# Patient Record
Sex: Female | Born: 1995 | Race: Black or African American | Hispanic: No | Marital: Single | Smoking: Never smoker
Health system: Southern US, Community
[De-identification: ages and names within clinical notes are randomized; demographics above are authoritative.]

---
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
## 2014-06-06 ENCOUNTER — Encounter (HOSPITAL_COMMUNITY): Payer: Self-pay | Admitting: *Deleted

## 2014-06-06 ENCOUNTER — Other Ambulatory Visit (HOSPITAL_COMMUNITY)
Admission: RE | Admit: 2014-06-06 | Discharge: 2014-06-06 | Disposition: A | Discharge status: Home / Self Care | Payer: Self-pay | Source: Ambulatory Visit | Attending: Emergency Medicine | Admitting: Emergency Medicine

## 2014-06-06 ENCOUNTER — Emergency Department (INDEPENDENT_AMBULATORY_CARE_PROVIDER_SITE_OTHER)
Admission: EM | Admit: 2014-06-06 | Discharge: 2014-06-06 | Disposition: A | Discharge status: Home / Self Care | Payer: 59 | Source: Home / Self Care | Attending: Emergency Medicine | Admitting: Emergency Medicine

## 2014-06-06 DIAGNOSIS — B373 Candidiasis of vulva and vagina: Secondary | ICD-10-CM

## 2014-06-06 DIAGNOSIS — Z113 Encounter for screening for infections with a predominantly sexual mode of transmission: Secondary | ICD-10-CM | POA: Insufficient documentation

## 2014-06-06 DIAGNOSIS — N760 Acute vaginitis: Secondary | ICD-10-CM | POA: Insufficient documentation

## 2014-06-06 DIAGNOSIS — N946 Dysmenorrhea, unspecified: Secondary | ICD-10-CM

## 2014-06-06 DIAGNOSIS — B3731 Acute candidiasis of vulva and vagina: Secondary | ICD-10-CM

## 2014-06-06 MED ORDER — NORGESTIMATE-ETH ESTRADIOL 0.25-35 MG-MCG PO TABS: 1.0000 | ORAL_TABLET | Freq: Every day | ORAL | Status: AC

## 2014-06-06 MED ORDER — KETOROLAC TROMETHAMINE 30 MG/ML IJ SOLN
30.0000 mg | Freq: Once | INTRAMUSCULAR | Status: AC
  Administered 2014-06-06: 30 mg via INTRAMUSCULAR

## 2014-06-06 MED ORDER — KETOROLAC TROMETHAMINE 30 MG/ML IJ SOLN
INTRAMUSCULAR | Status: AC
  Filled 2014-06-06: qty 1

## 2014-06-06 MED ORDER — FLUCONAZOLE 150 MG PO TABS: 150.0000 mg | ORAL_TABLET | Freq: Every day | ORAL | Status: AC

## 2014-06-06 NOTE — ED Notes (Signed)
C/o lower abdominal cramps onset this afternoon.  Her period started today.  States this is" alot worse than usual."  C/o vaginal discharge onset 1 week ago.  She thinks it is yeast.  This is the 3rd time in the past few months she has had a yeast infection.

## 2014-06-06 NOTE — ED Provider Notes (Signed)
CSN: [id number]     Arrival date & time 06/06/14  1649 History   First MD Initiated Contact with Patient 06/06/14 1659     Chief Complaint  Patient presents with  . Abdominal Cramping   (Consider location/radiation/quality/duration/timing/severity/associated sxs/prior Treatment) HPI  She is a 18 year old woman here for evaluation of abdominal cramping and vaginal discharge. She states her period started today and she has been having lower abdominal cramping. She states she typically gets some cramps with her period, but not this bad. She has taken Tylenol without improvement.  She also reports a thick white vaginal discharge for the last week. It is associated with some vaginal itching. She has had similar symptoms 3 times in the last several months. She has taken over-the-counter Monistat with minimal improvement.  She is sexually active with one partner. They use condoms. She denies any abdominal pain. No fevers or chills. No nausea or vomiting.  She would like a prescription for birth control. She does get occasional headaches. No personal or family history of blood clots. She is a nonsmoker.  History reviewed. No pertinent past medical history. History reviewed. No pertinent past surgical history. History reviewed. No pertinent family history. History  Substance Use Topics  . Smoking status: Never Smoker   . Smokeless tobacco: Not on file  . Alcohol Use: Yes     Comment: occ   OB History    No data available     Review of Systems  Constitutional: Negative for fever.  Gastrointestinal: Negative for nausea, vomiting and abdominal pain.  Genitourinary: Positive for vaginal discharge and pelvic pain (cramps).    Allergies  Review of patient's allergies indicates no known allergies.  Home Medications   Prior to Admission medications   Medication Sig Start Date End Date Taking? Authorizing Provider  acetaminophen (TYLENOL) 500 MG tablet Take 1,000 mg by mouth every 6 (six)  hours as needed.   Yes Historical Provider, MD  dicyclomine (BENTYL) 20 MG tablet Take 1 tablet (20 mg total) by mouth 2 (two) times daily. 10/11/12   Heather Laisure, PA-C  fluconazole (DIFLUCAN) 150 MG tablet Take 1 tablet (150 mg total) by mouth daily. 06/06/14   Charm RingsErin J Zamariya Neal, MD  ibuprofen (ADVIL,MOTRIN) 200 MG tablet Take 200-400 mg by mouth every 6 (six) hours as needed for pain.    Historical Provider, MD  norgestimate-ethinyl estradiol (ORTHO-CYCLEN,SPRINTEC,PREVIFEM) 0.25-35 MG-MCG tablet Take 1 tablet by mouth daily. 06/06/14   Charm RingsErin J Devere Brem, MD   BP 145/94 mmHg  Pulse 72  Temp(Src) 98.3 F (36.8 C) (Oral)  Resp 16  SpO2 99%  LMP 06/06/2014 Physical Exam  Constitutional: She is oriented to person, place, and time. She appears well-developed and well-nourished. No distress.  Cardiovascular: Normal rate.   Pulmonary/Chest: Effort normal.  Abdominal: Soft. Bowel sounds are normal. She exhibits no distension. There is tenderness (mild in lower qyadrants). There is no rebound and no guarding.  Genitourinary: There is no rash on the right labia. There is no rash on the left labia.  No discharge appreciated  Neurological: She is alert and oriented to person, place, and time.    ED Course  Procedures (including critical care time) Labs Review Labs Reviewed  CERVICOVAGINAL ANCILLARY ONLY    Imaging Review No results found.   MDM   1. Menstrual cramp   2. Yeast vaginitis    We'll treat menstrual cramps with 30 mg IM Toradol. Recommended taking Tylenol or ibuprofen at home.  Although I do not see  any discharge, she describes a yeast infection very well. Prescription for Diflucan provided.  Also provided a one month prescription for OCPs. She is going home in 2 weeks, I recommended she follow-up with her regular doctor to discuss alternative options for birth control.     Charm RingsErin J Tiawanna Luchsinger, MD 06/06/14 [phone]

## 2014-06-06 NOTE — Discharge Instructions (Signed)
Take the diflucan pill (for yeast) after you finish your period. Start the birth control pills today or next Sunday. The pills take 2 weeks to become effective. You must take the pill at the same time every day. Continue to use condoms to protect against STDs. Take ibuprofen or tylenol for menstrual cramps.  Follow up with your regular doctor at home to discuss additional birth control options.

## 2014-06-07 LAB — CERVICOVAGINAL ANCILLARY ONLY
Chlamydia: NEGATIVE
Neisseria Gonorrhea: NEGATIVE
Wet Prep (BD Affirm): NEGATIVE
Wet Prep (BD Affirm): NEGATIVE
Wet Prep (BD Affirm): POSITIVE — AB

## 2014-06-07 MED ORDER — METRONIDAZOLE 500 MG PO TABS: 500.0000 mg | ORAL_TABLET | Freq: Two times a day (BID) | ORAL | Status: DC

## 2014-06-08 ENCOUNTER — Telehealth (HOSPITAL_COMMUNITY): Payer: Self-pay | Admitting: *Deleted

## 2014-06-08 NOTE — ED Notes (Addendum)
GC/Chlamydia neg., Affirm: Candida and Trich neg., Gardnerella pos.  Dr.Honig e-[facility] Flag[facility]. I called pt. and left a message to call.  Call 1. Vassie Mos[place]York, Mychael Smock M 06/08/2014 Left message.  Call 2. 06/09/2014 I called pt.  Pt. verified x 2 and given results. Pt.told she needs Flagyl for bacterial vaginosis and where to pick up Rx. States she already picked it up but has not started taking it yet.   Pt. instructed to no alcohol while taking this medication.  Pt. voiced understanding. 06/14/2014

## 2014-06-21 NOTE — ED Notes (Signed)
Phenergan 25 mg , 1 q 6 H quant #10, PRN nausea, no refill. [facility] [address] [facility] [place] answering machine at pharmacy

## 2014-06-21 NOTE — ED Notes (Signed)
Called to c/o she has vomited x 2 today . Reportedly started Rx flagyl on 11-27 from 11-17 visit. Discussed w Dr Artis FlockKindl, who advises patient this is the only thing that treats her problem, and she needs to tke as Rx until completed. Reminded she is to have NO ETOH while she is taking this medication

## 2014-08-05 ENCOUNTER — Emergency Department (HOSPITAL_COMMUNITY)
Admission: EM | Admit: 2014-08-05 | Discharge: 2014-08-05 | Disposition: A | Discharge status: Home / Self Care | Payer: 59 | Source: Home / Self Care | Attending: Family Medicine | Admitting: Family Medicine

## 2014-08-05 ENCOUNTER — Encounter (HOSPITAL_COMMUNITY): Payer: Self-pay | Admitting: Family Medicine

## 2014-08-05 DIAGNOSIS — R109 Unspecified abdominal pain: Secondary | ICD-10-CM

## 2014-08-05 LAB — POCT URINALYSIS DIP (DEVICE)
Bilirubin Urine: NEGATIVE
GLUCOSE, UA: NEGATIVE mg/dL
KETONES UR: NEGATIVE mg/dL
Leukocytes, UA: NEGATIVE
Nitrite: NEGATIVE
Protein, ur: NEGATIVE mg/dL
SPECIFIC GRAVITY, URINE: 1.02 (ref 1.005–1.030)
UROBILINOGEN UA: 0.2 mg/dL (ref 0.0–1.0)
pH: 7 (ref 5.0–8.0)

## 2014-08-05 LAB — POCT PREGNANCY, URINE: PREG TEST UR: NEGATIVE

## 2014-08-05 MED ORDER — METRONIDAZOLE 0.75 % EX GEL: 1.0000 "application " | Freq: Two times a day (BID) | CUTANEOUS | Status: AC

## 2014-08-05 MED ORDER — IBUPROFEN 600 MG PO TABS: 600.0000 mg | ORAL_TABLET | Freq: Four times a day (QID) | ORAL | Status: AC | PRN

## 2014-08-05 NOTE — ED Provider Notes (Signed)
CSN: [id number]     Arrival date & time 08/05/14  1126 History   First MD Initiated Contact with Patient 08/05/14 1153     Chief Complaint  Patient presents with  . Abdominal Cramping   (Consider location/radiation/quality/duration/timing/severity/associated sxs/prior Treatment) HPI  Abd cramping. Started last night. Improves w/ rest. Worse w/ movement. LMP started yesterday. Denies dysuria, frequency, back pain, fevers. Entire abdomen. Tolerating PO and daily BMs. Takes daily OCP and has been on them for 2 mos. Some vaginal discharge w/o odor or irritation prior to onset of period. No further discharge.   BV 8 wks ago and prescribed metro gel and could not tolerate due to GI upset. Started the [employer] and finished last dose a couple weeks ago.    History reviewed. No pertinent past medical history. History reviewed. No pertinent past surgical history. Family History  Problem Relation Age of Onset  . Ovarian cysts Paternal Aunt   . Ovarian cysts Paternal Grandmother    History  Substance Use Topics  . Smoking status: Never Smoker   . Smokeless tobacco: Not on file  . Alcohol Use: Yes     Comment: occ   OB History    No data available     Review of Systems Per HPI with all other pertinent systems negative.   Allergies  Review of patient's allergies indicates no known allergies.  Home Medications   Prior to Admission medications   Medication Sig Start Date End Date Taking? Authorizing Provider  norgestimate-ethinyl estradiol (ORTHO-CYCLEN,SPRINTEC,PREVIFEM) 0.25-35 MG-MCG tablet Take 1 tablet by mouth daily. 06/06/14  Yes Charm Rings, MD  acetaminophen (TYLENOL) 500 MG tablet Take 1,000 mg by mouth every 6 (six) hours as needed.    Historical Provider, MD  dicyclomine (BENTYL) 20 MG tablet Take 1 tablet (20 mg total) by mouth 2 (two) times daily. 10/11/12   Heather Laisure, PA-C  fluconazole (DIFLUCAN) 150 MG tablet Take 1 tablet (150 mg total) by mouth daily. 06/06/14    Charm Rings, MD  ibuprofen (ADVIL,MOTRIN) 600 MG tablet Take 1 tablet (600 mg total) by mouth every 6 (six) hours as needed. 08/05/14   Ozella Rocks, MD  metroNIDAZOLE (METROGEL) 0.75 % gel Apply 1 application topically 2 (two) times daily. Treat for 5 days 08/05/14   Ozella Rocks, MD   BP 123/91 mmHg  Pulse 82  Temp(Src) 98.5 F (36.9 C) (Oral)  Resp 14  SpO2 99%  LMP 08/05/2014 (Approximate) Physical Exam  Constitutional: She is oriented to person, place, and time. She appears well-developed and well-nourished. No distress.  HENT:  Head: Normocephalic and atraumatic.  Eyes: EOM are normal. Pupils are equal, round, and reactive to light.  Neck: Normal range of motion.  Cardiovascular: Normal rate and normal heart sounds.   Pulmonary/Chest: Effort normal and breath sounds normal.  Abdominal: Soft. Bowel sounds are normal.  Musculoskeletal: Normal range of motion. She exhibits no edema or tenderness.  Neurological: She is alert and oriented to person, place, and time.  Skin: Skin is warm. She is not diaphoretic.  Psychiatric: She has a normal mood and affect. Her behavior is normal. Judgment normal.    ED Course  Procedures (including critical care time) Labs Review Labs Reviewed - No data to display  Imaging Review No results found.   MDM   1. Abdominal cramping    Likely related to hormonal changes from OCP causing worsening menstrual cramping. May also be related to ovarian cysts as multlipel family members w/  PCOS.  Urine preg neg UA nml Motrin Refill MetroGel if discharge returns after finishing menstrual cycle.  Precautions given and all questions answered   Shelly Flattenavid Merrell, MD Family Medicine 08/05/2014, 12:13 PM      Ozella Rocksavid J Merrell, MD 08/05/14 [phone]

## 2014-08-05 NOTE — Discharge Instructions (Signed)
Your symptoms are likely related to the additional hormones from the birth control pills.  You may have developed small painful cysts on your ovaries This may need further workup if the pain continues for several months or gets worse.  Please use ibuprofen for the pain There was no evidence of infection or pregnancy today Please follow up with an OBGYN or your primary doctor if this continues

## 2014-08-05 NOTE — ED Notes (Signed)
Pt complains of abdominal cramping that started last night.  Pt is currently menstruating.

## 2014-08-05 NOTE — ED Notes (Signed)
Patient attempted, however, patient can not void at this time. Nurse is aware that specimen was not obtained

## 2014-08-06 ENCOUNTER — Other Ambulatory Visit: Payer: Self-pay | Admitting: Physician Assistant

## 2014-08-06 DIAGNOSIS — R1031 Right lower quadrant pain: Secondary | ICD-10-CM

## 2014-08-11 ENCOUNTER — Ambulatory Visit
Admission: RE | Admit: 2014-08-11 | Discharge: 2014-08-11 | Disposition: A | Discharge status: Home / Self Care | Payer: 59 | Source: Ambulatory Visit | Attending: Physician Assistant | Admitting: Physician Assistant

## 2014-08-11 DIAGNOSIS — R1031 Right lower quadrant pain: Secondary | ICD-10-CM

## 2015-06-09 IMAGING — US US PELVIS COMPLETE
1 series · 14 of 25 positions shown · non-contrast
Comparison: None

CLINICAL DATA: 19-year-old female with right lower quadrant
abdominal pain. Pain for 1 week. Initial encounter. LMP 08/04/2014.



[Series 1: us pelvis complete · 0.22mm/px · 14 of 61 slices shown]
[im 1/61]
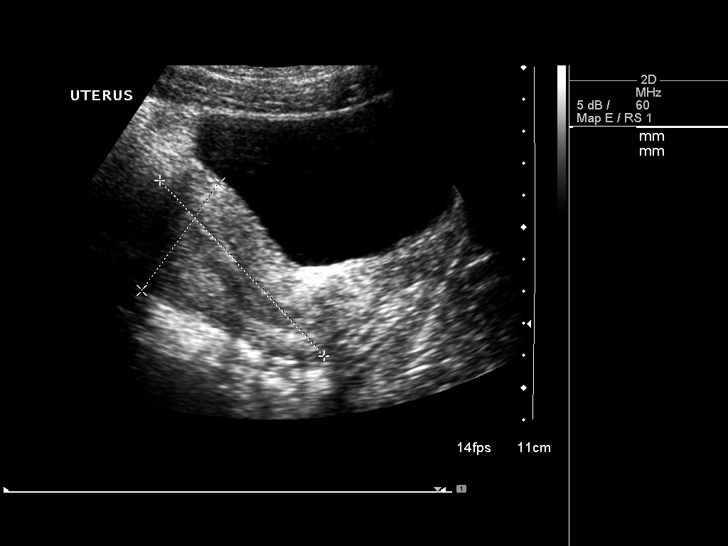
[im 6/61]
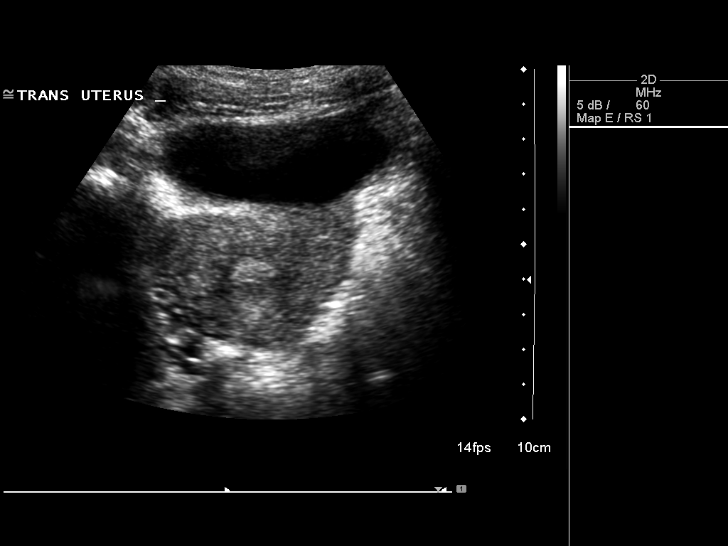
[im 11/61]
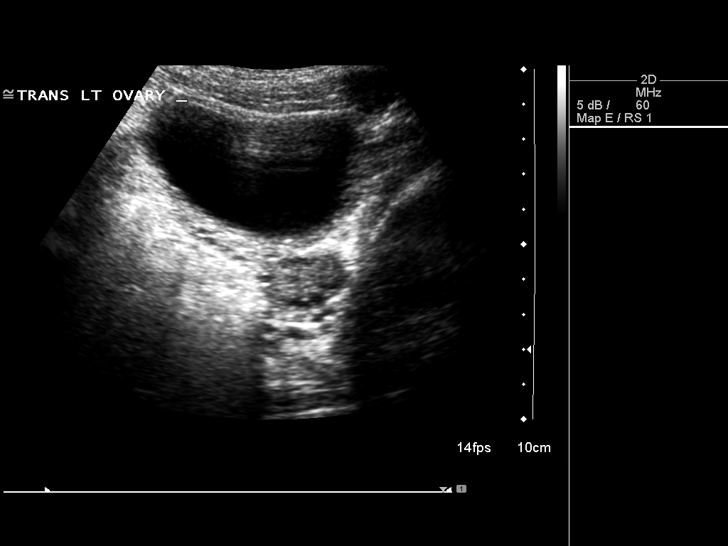
[im 16/61]
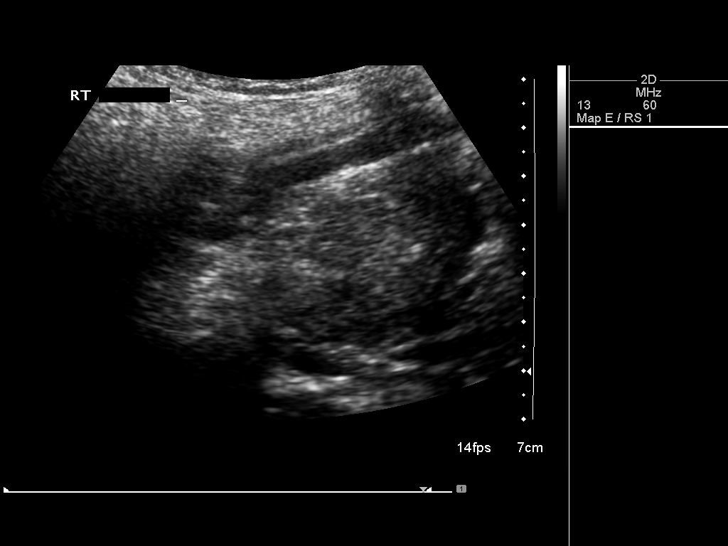
[im 21/61]
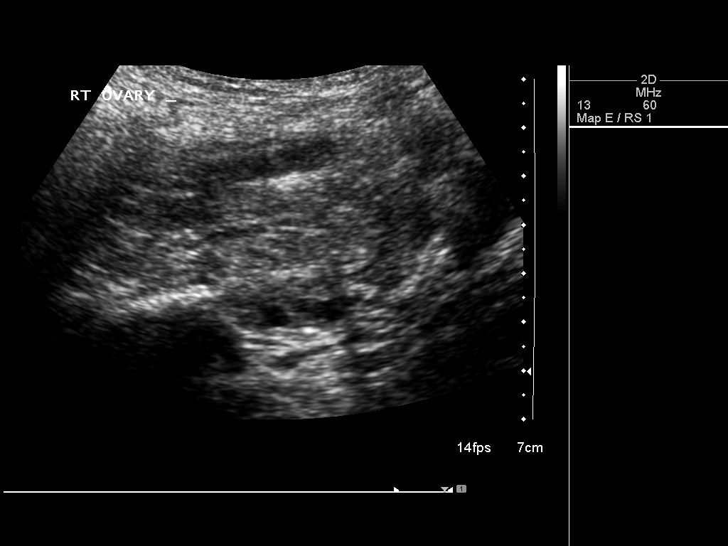
[im 23/61]
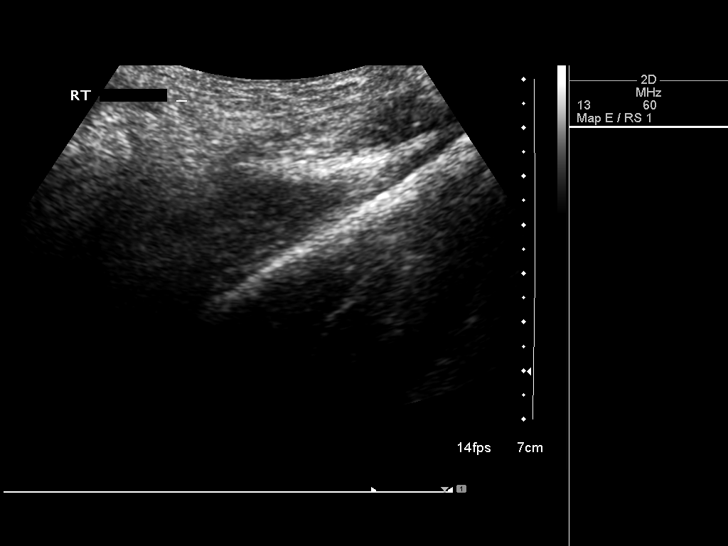
[im 28/61]
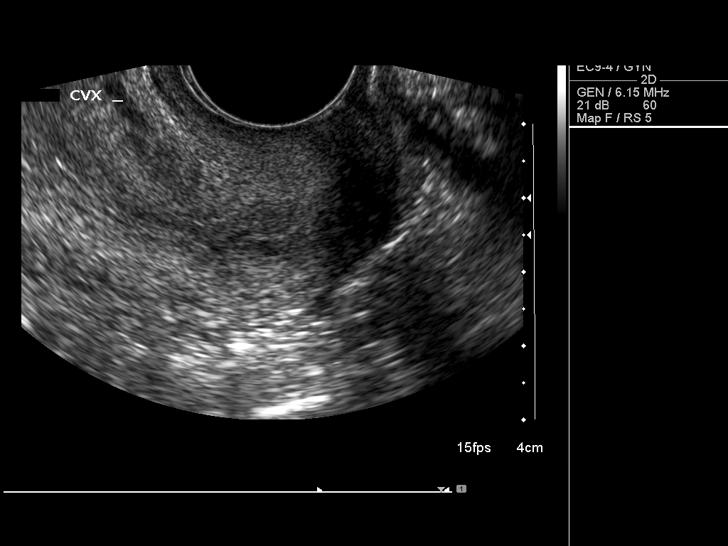
[im 33/61]
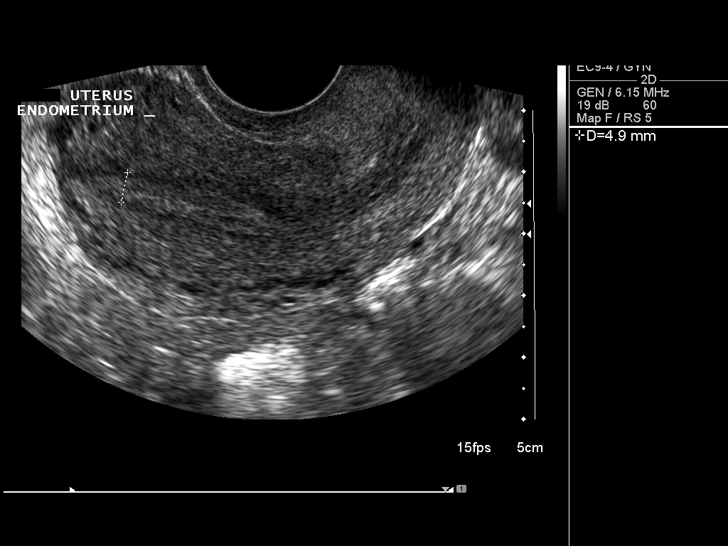
[im 38/61]
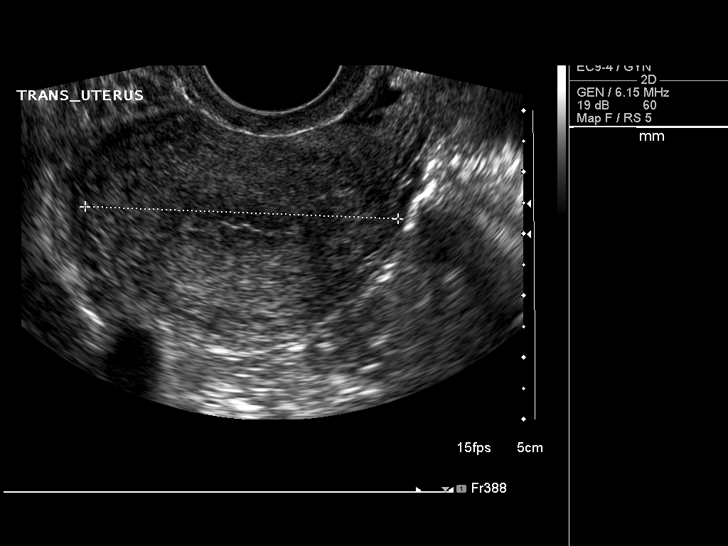
[im 41/61]
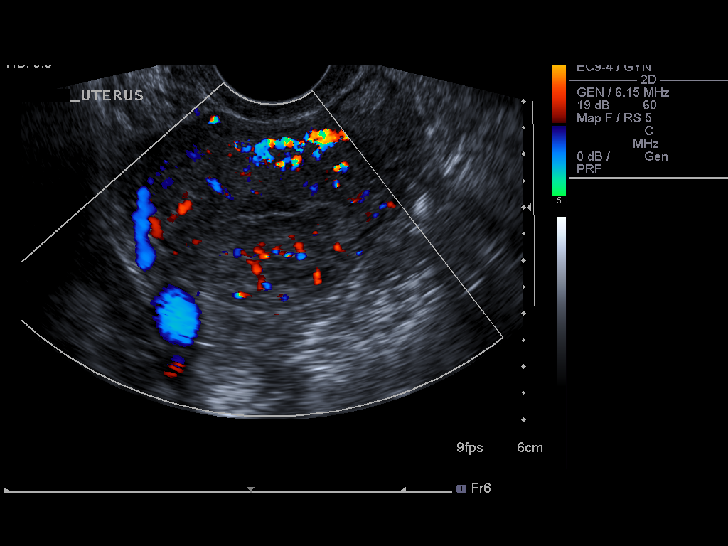
[im 46/61]
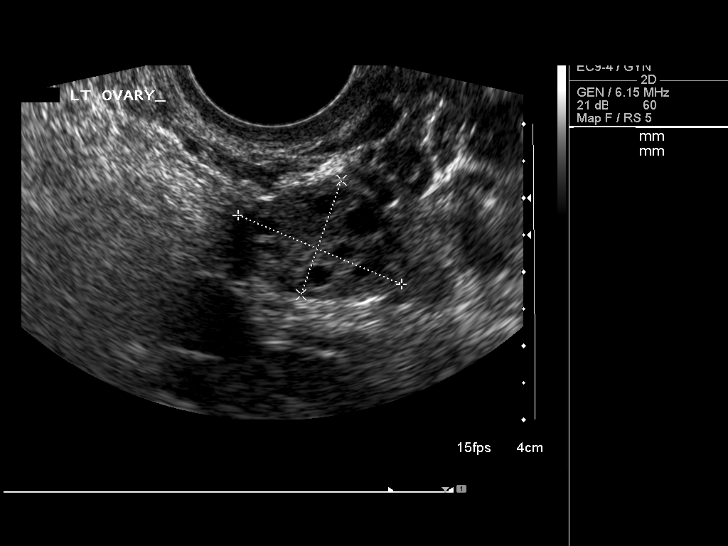
[im 51/61]
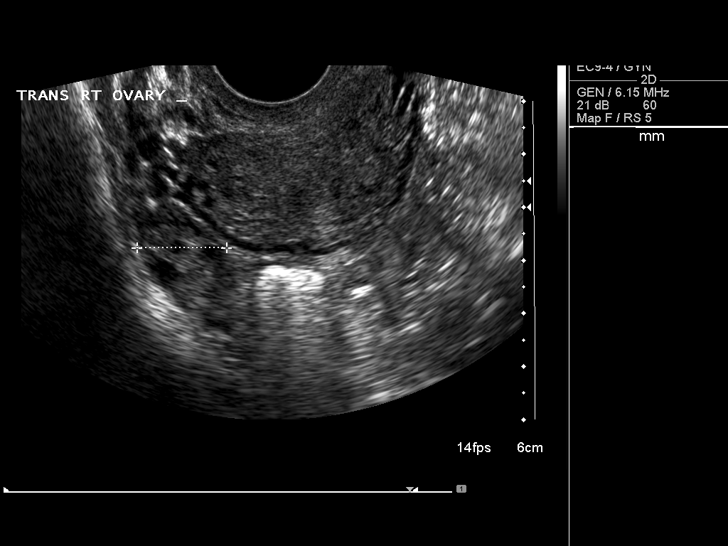
[im 56/61]
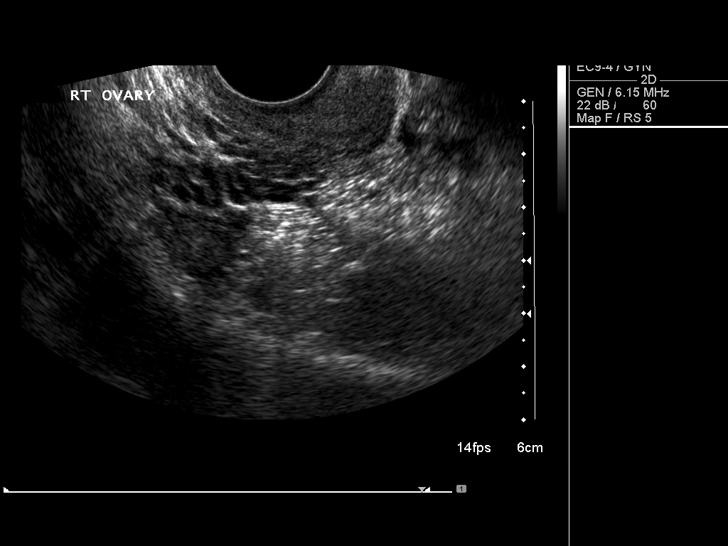
[im 61/61]
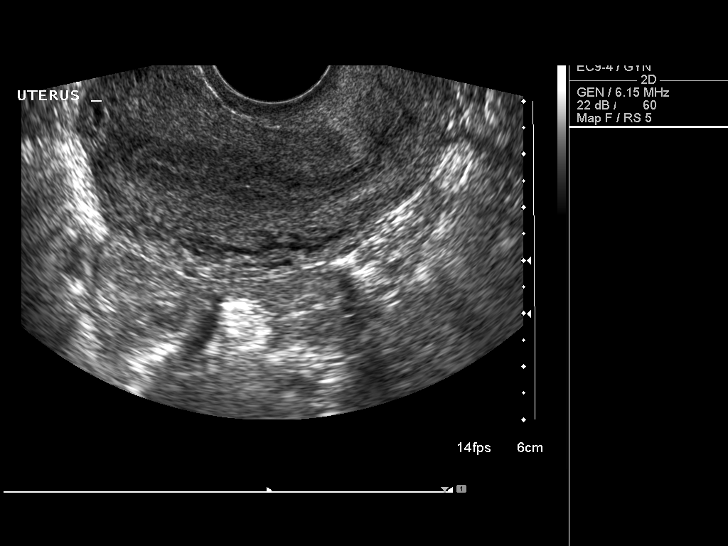

[14 of 25 positions shown; findings below may reference images not displayed]

FINDINGS: Uterus

Measurements: 7.5 x 4.1 x 5.2 cm. No fibroids or other mass
visualized.

Endometrium

Thickness: 5 mm.  No focal abnormality visualized.

Right ovary

Measurements: 2.9 x 1.6 x 1.7 cm. Multiple small follicles. Normal
appearance/no adnexal mass.

Left ovary

Measurements: 2.4 x 1.6 x 2.7 cm. Multiple small follicles. Normal
appearance/no adnexal mass.

Other findings

No free fluid.
IMPRESSION: Normal pelvis ultrasound.
